# Patient Record
Sex: Male | Born: 1975 | Hispanic: No | Marital: Married
Health system: Southern US, Community
[De-identification: ages and names within clinical notes are randomized; demographics above are authoritative.]

---

## 2003-04-28 ENCOUNTER — Encounter: Admission: RE | Admit: 2003-04-28 | Discharge: 2003-04-28 | Payer: Self-pay | Admitting: Specialist

## 2005-02-16 IMAGING — CR DG CHEST 1V
1 series · 1 of 1 positions shown · non-contrast
Comparison: none

CLINICAL DATA: Positive PPD.
 CHEST
 A PA view of the chest was obtained.  No pneumonia is seen.  There are somewhat prominent perihilar markings with peribronchial cuffing which may indicate bronchitis.  The heart is within upper limits of normal.
 IMPRESSION
 1.  Probable bronchitis.  No pneumonia.

[view not recorded]
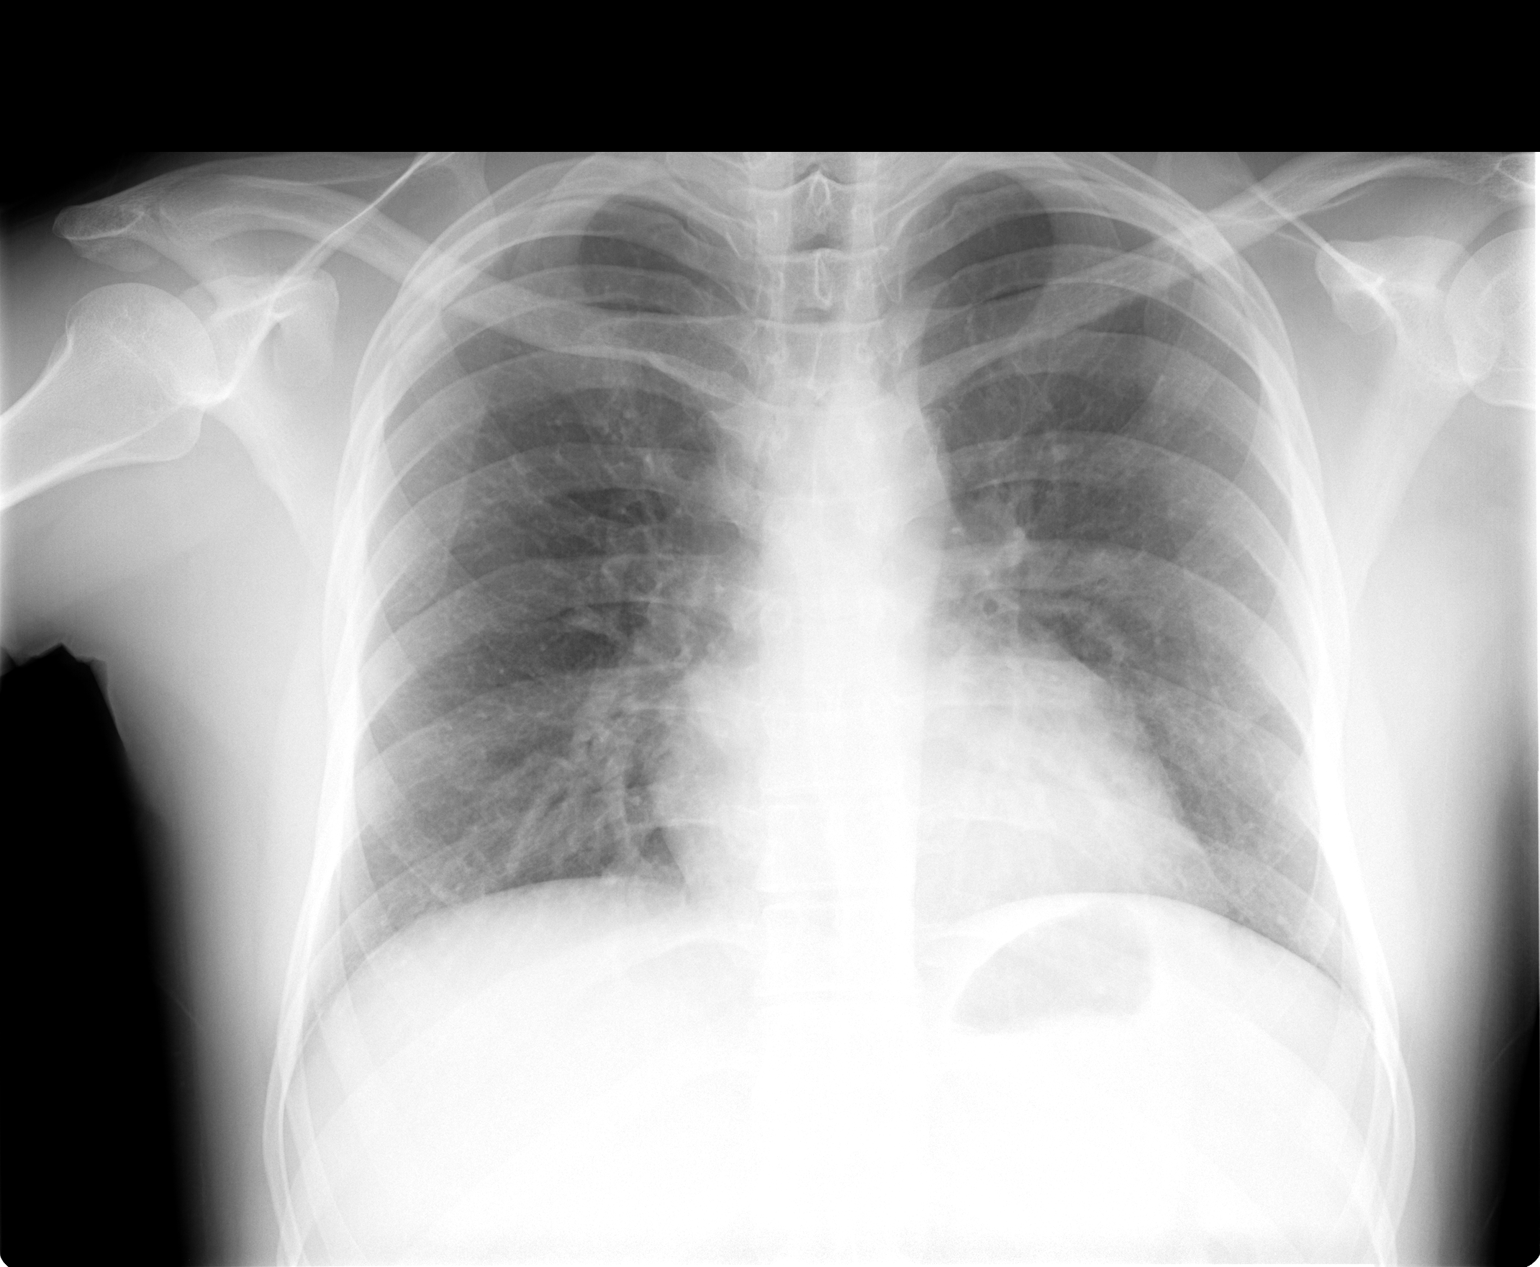

[1 of 1 positions shown; findings below may reference images not displayed]

## 2021-12-03 ENCOUNTER — Ambulatory Visit: Payer: 59 | Admitting: Podiatry

## 2021-12-03 ENCOUNTER — Encounter: Payer: Self-pay | Admitting: Podiatry

## 2021-12-03 DIAGNOSIS — B070 Plantar wart: Secondary | ICD-10-CM

## 2021-12-03 NOTE — Progress Notes (Signed)
  Subjective:  Patient ID: Lance Salazar, male    DOB: May 03, 1975,   MRN: [id number]  Chief Complaint  Patient presents with   Plantar Warts    Right foot plantar wart on going for 2 years    46 y.o. male presents for concern of wart on right foot that has been present for two years. Relates he has had it frozen off several times with no improvement. Has also been using prescription and OTC salicylic acid.  . Denies any other pedal complaints. Denies n/v/f/c.   No past medical history on file.  Objective:  Physical Exam: Vascular: DP/PT pulses 2/4 bilateral. CFT <3 seconds. Normal hair growth on digits. No edema.  Skin. No lacerations or abrasions bilateral feet. Right foot lesion plantar with minimal capillary budding is noted throughout with cauliflower-like appearance and loss of skin tension lines within the lesion itself. Musculoskeletal: MMT 5/5 bilateral lower extremities in DF, PF, Inversion and Eversion. Deceased ROM in DF of ankle joint.  Neurological: Sensation intact to light touch.   Assessment:   1. Plantar wart, right foot      Plan:  Patient was evaluated and treated and all questions answered. -Discussed warts and their etiology with patient and treatment options.  -Hyperkeratotic tissue was debrided with chisel without incident. .  -Application of cantrhone provided today. Advised patient to remove bandaging tomorrow.  -Discussed future options such as laser treatment if unsuccessful.  -Advised good supportive shoes and inserts -Patient to return to office in 3 weeks or sooner if condition worsens.   Louann Sjogren, DPM

## 2021-12-09 ENCOUNTER — Ambulatory Visit: Payer: 59 | Admitting: Podiatry

## 2021-12-23 ENCOUNTER — Encounter: Payer: Self-pay | Admitting: Podiatrist

## 2021-12-23 ENCOUNTER — Ambulatory Visit: Payer: 59 | Admitting: Podiatrist

## 2021-12-23 DIAGNOSIS — B070 Plantar wart: Secondary | ICD-10-CM

## 2021-12-23 NOTE — Patient Instructions (Signed)
AFTERCARE INSTRUCTIONS FOR CANTHARIDIN (WART) TREATMENT:  1:  Leave the tape on the affected area for 24 hours after treatment-  do not get the foot wet. ** if you experience any pain, burning, or discomfort wash the area sooner than 24 hours.   2. After 24 hours- remove the tape and wash the wart with soap and water  3.  Expect blistering the day after treatment-  the blister may enlarge and fill with fluid-  if this occurs you may make a small hole in the blister using a sterile needle (can also dip a pin or sewing needle in rubbing alcohol and wipe dry) and squeeze fluid out by gently squeezing blister with clean hands. You may also apply a cool compress and take oral pain medications (tylenol or advil) for pain.  4.  Do not remove the top of the blister.  Apply vaseline twice daily for 5-10 days or until healing occurs.  A crusty lesion will occur and this will fall off on its own after 4-5 days.  5.  After skin healing has occurred after cantharidin treatment, apply   over the counter salicylic acid 17% (compound W or similar)      to the base of the lesion once a day for 5-7 days as tolerated to prevent wart recurrences around the treatment edges.    6.  The treated area may look red or may itch.  Call the office if there is still significant burning, stinging or pain after the blister is popped.   7.  It is rare, but anyone can have a severe allergic reaction to any medication-  call 911 if you have a severe reaction (trouble breathing, hives, trouble swallowing) after treatment.   

## 2021-12-23 NOTE — Progress Notes (Signed)
Chief Complaint  Patient presents with   Plantar Warts    Plantar wart of the right foot patient states wart is much better since the last visit       46 y.o. male presents for 3 week follow up of wart on right foot.  He saw Dr. Blenda Mounts who treated the lesion with debridement and cantharone.  He relates it got red and purple and dead skin fell off  . Denies any other pedal complaints. Denies n/v/f/c.    No past medical history on file.   Objective:  Physical Exam: Vascular: Right foot DP/PT pulses 2/4  CFT <3 seconds. Normal hair growth on digits. No edema.  Skin. Right foot lesion submetatarsal 1 plantar lesion mostly resolved.  A small sliver of tissue with capillary budding is noted throughout with loss of skin tension lines within the lesion itself. Musculoskeletal: MMT 5/5 bilateral lower extremities in DF, PF, Inversion and Eversion. Deceased ROM in DF of ankle joint.  Neurological: Sensation intact to light touch.    Assessment:      ICD-10-CM   1. Plantar wart, right foot  B07.0       Plan:  Patient was evaluated and treated and all questions answered. -Discussed warts and their etiology with patient and treatment options.  -Hyperkeratotic tissue was debrided with 15 blade without incident. .  -Application of cantharone provided today. Advised patient to remove tape tomorrow.  -Gave aftercare instructions post cantharone treatment -Patient to return to office in 3 weeks or sooner if condition worsens.

## 2022-01-13 ENCOUNTER — Encounter: Payer: Self-pay | Admitting: Podiatrist

## 2022-01-13 ENCOUNTER — Ambulatory Visit: Payer: 59 | Admitting: Podiatrist

## 2022-01-13 DIAGNOSIS — B070 Plantar wart: Secondary | ICD-10-CM | POA: Diagnosis not present

## 2022-01-13 MED ORDER — FLUOROURACIL 5 % EX CREA: TOPICAL_CREAM | Freq: Two times a day (BID) | CUTANEOUS | 3 refills | Status: AC

## 2022-01-13 NOTE — Patient Instructions (Addendum)
AFTERCARE INSTRUCTIONS FOR CANTHARIDIN (WART) TREATMENT:  1:  Leave the tape on the affected area for 24 hours after treatment-  do not get the foot wet. ** if you experience any pain, burning, or discomfort wash the area sooner than 24 hours.   2. After 24 hours- remove the tape and wash the wart with soap and water  3.  Expect blistering the day after treatment-  the blister may enlarge and fill with fluid-  if this occurs you may make a small hole in the blister using a sterile needle (can also dip a pin or sewing needle in rubbing alcohol and wipe dry) and squeeze fluid out by gently squeezing blister with clean hands. You may also apply a cool compress and take oral pain medications (tylenol or advil) for pain.  4.  Do not remove the top of the blister.  Apply vaseline twice daily for 5-10 days or until healing occurs.  A crusty lesion will occur and this will fall off on its own after 4-5 days.  5.  After skin healing has occurred after cantharidin treatment, apply 5 flourouricil  to the base of the lesion once a day for 5-7 days as tolerated to prevent wart recurrences around the treatment edges.    6.  The treated area may look red or may itch.  Call the office if there is still significant burning, stinging or pain after the blister is popped.   7.  It is rare, but anyone can have a severe allergic reaction to any medication-  call 911 if you have a severe reaction (trouble breathing, hives, trouble swallowing) after treatment.

## 2022-01-13 NOTE — Progress Notes (Signed)
Chief Complaint  Patient presents with   Plantar Warts    Patient states patient wart is the same since the lat visit      HPI: Patient is 46 y.o. male who presents today with his wife for follow up plantar wart on the bottom of the right foot.  He relates the area will improve and then get worse and come back.  He has tried the canthacur treatment and salacylic acid and the lesion fails to resolve. He has been dealing with this area for close to or over a year now.    Allergies  Allergen Reactions   Other Anaphylaxis    Green peas    Review of systems is negative except as noted in the HPI.  Denies nausea/ vomiting/ fevers/ chills or night sweats.   Denies difficulty breathing, denies calf pain or tenderness  Physical Exam Physical Exam: Vascular: Right foot DP/PT pulses 2/4  CFT <3 seconds. Normal hair growth on digits. No edema.   Skin. Right foot lesion submetatarsal 1 plantar lesion is still present.  A small superficial area of tissue with capillary budding is noted throughout with loss of skin tension lines within the lesion itself. 2 small round lesions have developed just medial to the original wart which also appear wart like in nature.   Neurological: Sensation intact to light touch.     Assessment:   ICD-10-CM   1. Plantar wart, right foot  B07.0        Plan: Discussed exam findings and recommendations.  The wart has not improved much from the last visit when I saw him.  At today's visit I debrided the warty tissue and applied Canthacur to the lesion itself.  I wrote a prescription for 5-fluorouracil for him to apply to the wart at home.  I recommended he follow-up with one of our surgeons in the [facility] as he may need to have the lesion surgically excised with laser treatment.  He will call in the meantime if any problems or concerns arise otherwise he will follow-up in [facility] for further care.
